# Patient Record
Sex: Male | Born: 1975 | Race: White | Hispanic: No | Marital: Married | Smoking: Current every day smoker
Health system: Southern US, Community
[De-identification: ages and names within clinical notes are randomized; demographics above are authoritative.]

## PROBLEM LIST (undated history)

## (undated) DIAGNOSIS — F319 Bipolar disorder, unspecified: Secondary | ICD-10-CM

## (undated) DIAGNOSIS — R569 Unspecified convulsions: Secondary | ICD-10-CM

## (undated) DIAGNOSIS — I2699 Other pulmonary embolism without acute cor pulmonale: Secondary | ICD-10-CM

## (undated) HISTORY — PX: SPINAL FUSION: SHX223

---

## 2005-04-22 DIAGNOSIS — I2699 Other pulmonary embolism without acute cor pulmonale: Secondary | ICD-10-CM

## 2005-04-22 HISTORY — DX: Other pulmonary embolism without acute cor pulmonale: I26.99

## 2005-08-10 ENCOUNTER — Emergency Department (HOSPITAL_COMMUNITY): Admission: EM | Admit: 2005-08-10 | Discharge: 2005-08-10 | Payer: Self-pay | Admitting: Emergency Medicine

## 2005-10-27 ENCOUNTER — Emergency Department (HOSPITAL_COMMUNITY): Admission: EM | Admit: 2005-10-27 | Discharge: 2005-10-27 | Payer: Self-pay | Admitting: Emergency Medicine

## 2005-11-01 ENCOUNTER — Emergency Department (HOSPITAL_COMMUNITY): Admission: EM | Admit: 2005-11-01 | Discharge: 2005-11-01 | Payer: Self-pay | Admitting: Emergency Medicine

## 2005-12-04 ENCOUNTER — Emergency Department (HOSPITAL_COMMUNITY): Admission: EM | Admit: 2005-12-04 | Discharge: 2005-12-04 | Payer: Self-pay | Admitting: Emergency Medicine

## 2006-03-06 ENCOUNTER — Emergency Department (HOSPITAL_COMMUNITY): Admission: EM | Admit: 2006-03-06 | Discharge: 2006-03-06 | Payer: Self-pay | Admitting: Emergency Medicine

## 2006-04-03 ENCOUNTER — Emergency Department (HOSPITAL_COMMUNITY): Admission: EM | Admit: 2006-04-03 | Discharge: 2006-04-03 | Payer: Self-pay | Admitting: Emergency Medicine

## 2006-04-07 ENCOUNTER — Emergency Department (HOSPITAL_COMMUNITY): Admission: EM | Admit: 2006-04-07 | Discharge: 2006-04-07 | Payer: Self-pay | Admitting: Emergency Medicine

## 2006-04-13 ENCOUNTER — Emergency Department (HOSPITAL_COMMUNITY): Admission: EM | Admit: 2006-04-13 | Discharge: 2006-04-13 | Payer: Self-pay | Admitting: Emergency Medicine

## 2006-04-19 ENCOUNTER — Emergency Department (HOSPITAL_COMMUNITY): Admission: EM | Admit: 2006-04-19 | Discharge: 2006-04-19 | Payer: Self-pay | Admitting: Emergency Medicine

## 2006-07-12 ENCOUNTER — Emergency Department (HOSPITAL_COMMUNITY): Admission: EM | Admit: 2006-07-12 | Discharge: 2006-07-12 | Payer: Self-pay | Admitting: Emergency Medicine

## 2006-09-21 ENCOUNTER — Emergency Department (HOSPITAL_COMMUNITY): Admission: EM | Admit: 2006-09-21 | Discharge: 2006-09-21 | Payer: Self-pay | Admitting: Emergency Medicine

## 2006-09-26 ENCOUNTER — Emergency Department (HOSPITAL_COMMUNITY): Admission: EM | Admit: 2006-09-26 | Discharge: 2006-09-26 | Payer: Self-pay | Admitting: Emergency Medicine

## 2006-12-14 ENCOUNTER — Emergency Department (HOSPITAL_COMMUNITY): Admission: EM | Admit: 2006-12-14 | Discharge: 2006-12-15 | Payer: Self-pay | Admitting: Emergency Medicine

## 2007-03-09 ENCOUNTER — Emergency Department (HOSPITAL_COMMUNITY): Admission: EM | Admit: 2007-03-09 | Discharge: 2007-03-09 | Payer: Self-pay | Admitting: Emergency Medicine

## 2007-12-10 IMAGING — CR DG CHEST 2V
2 series · 2 of 2 positions shown · non-contrast
Comparison: 12/14/06 and earlier.

CLINICAL DATA: 31-year-old male with chest pain, shortness of breath, and blurred vision. 
CHEST - 2 VIEW:

[view not recorded (1 of 2)]
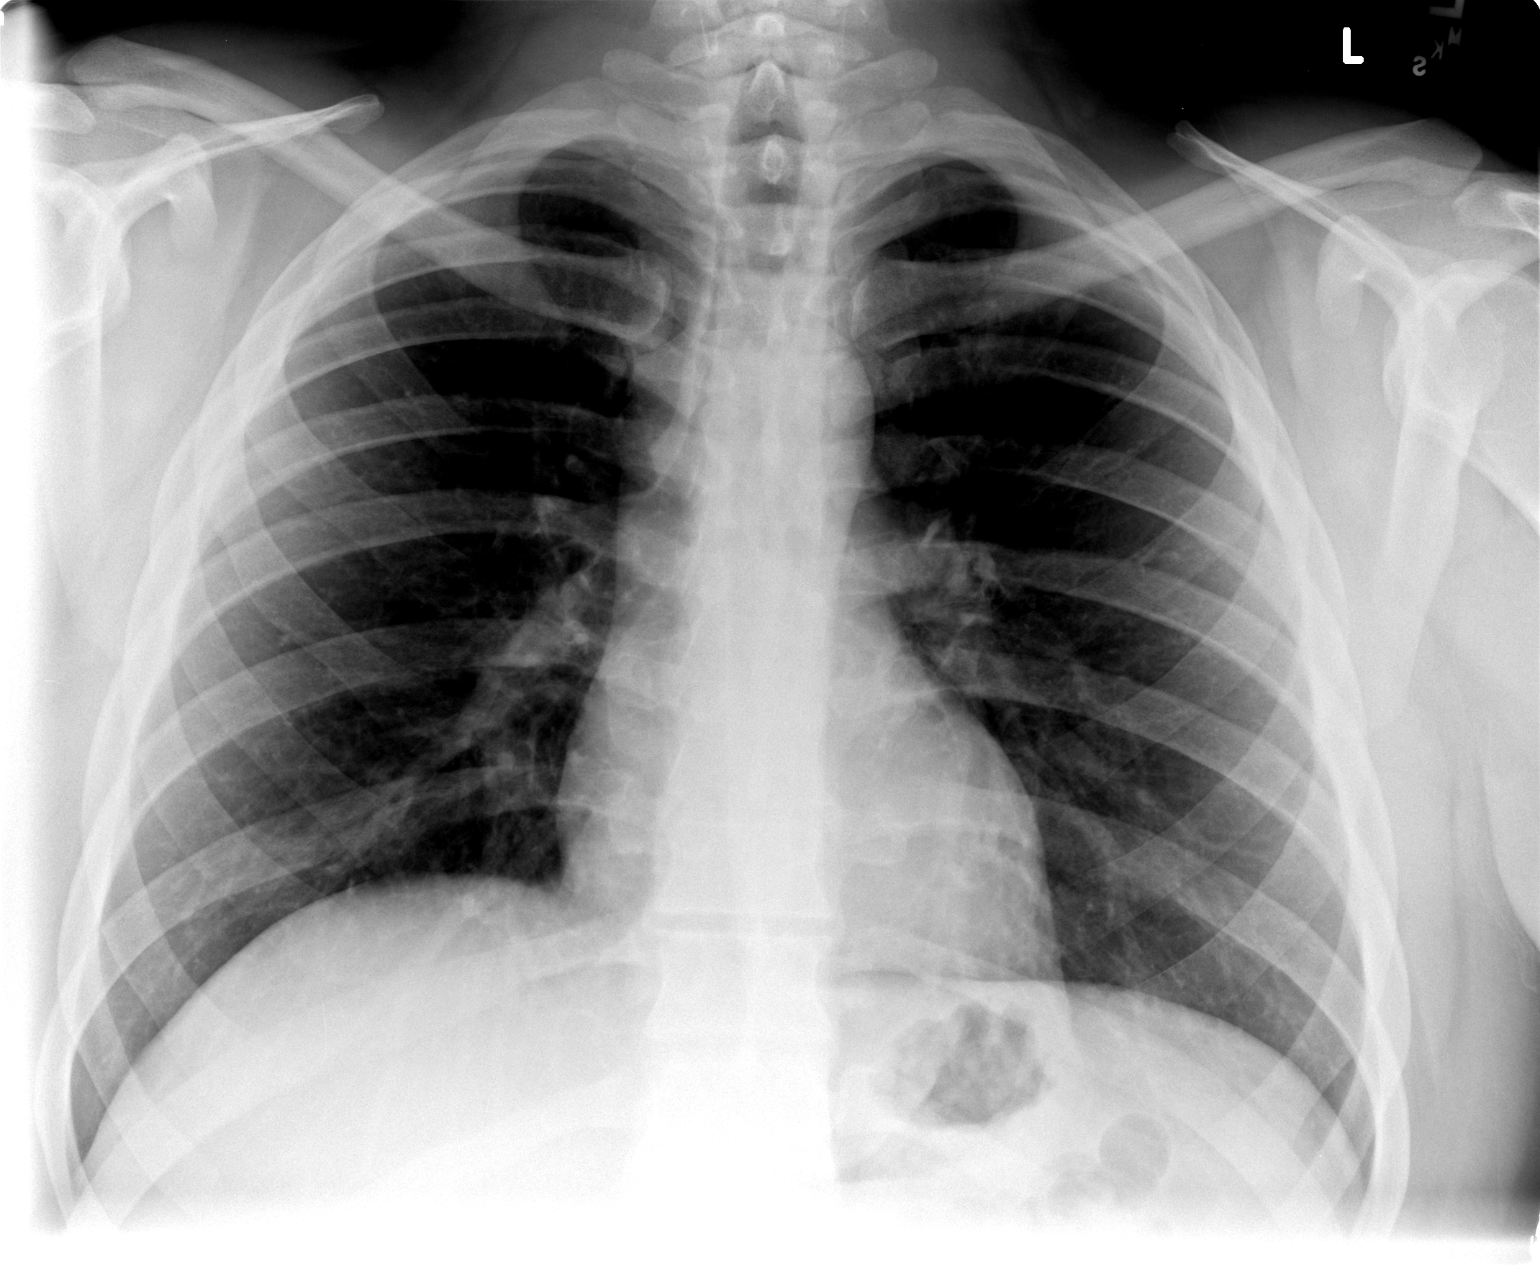

[view not recorded (2 of 2)]
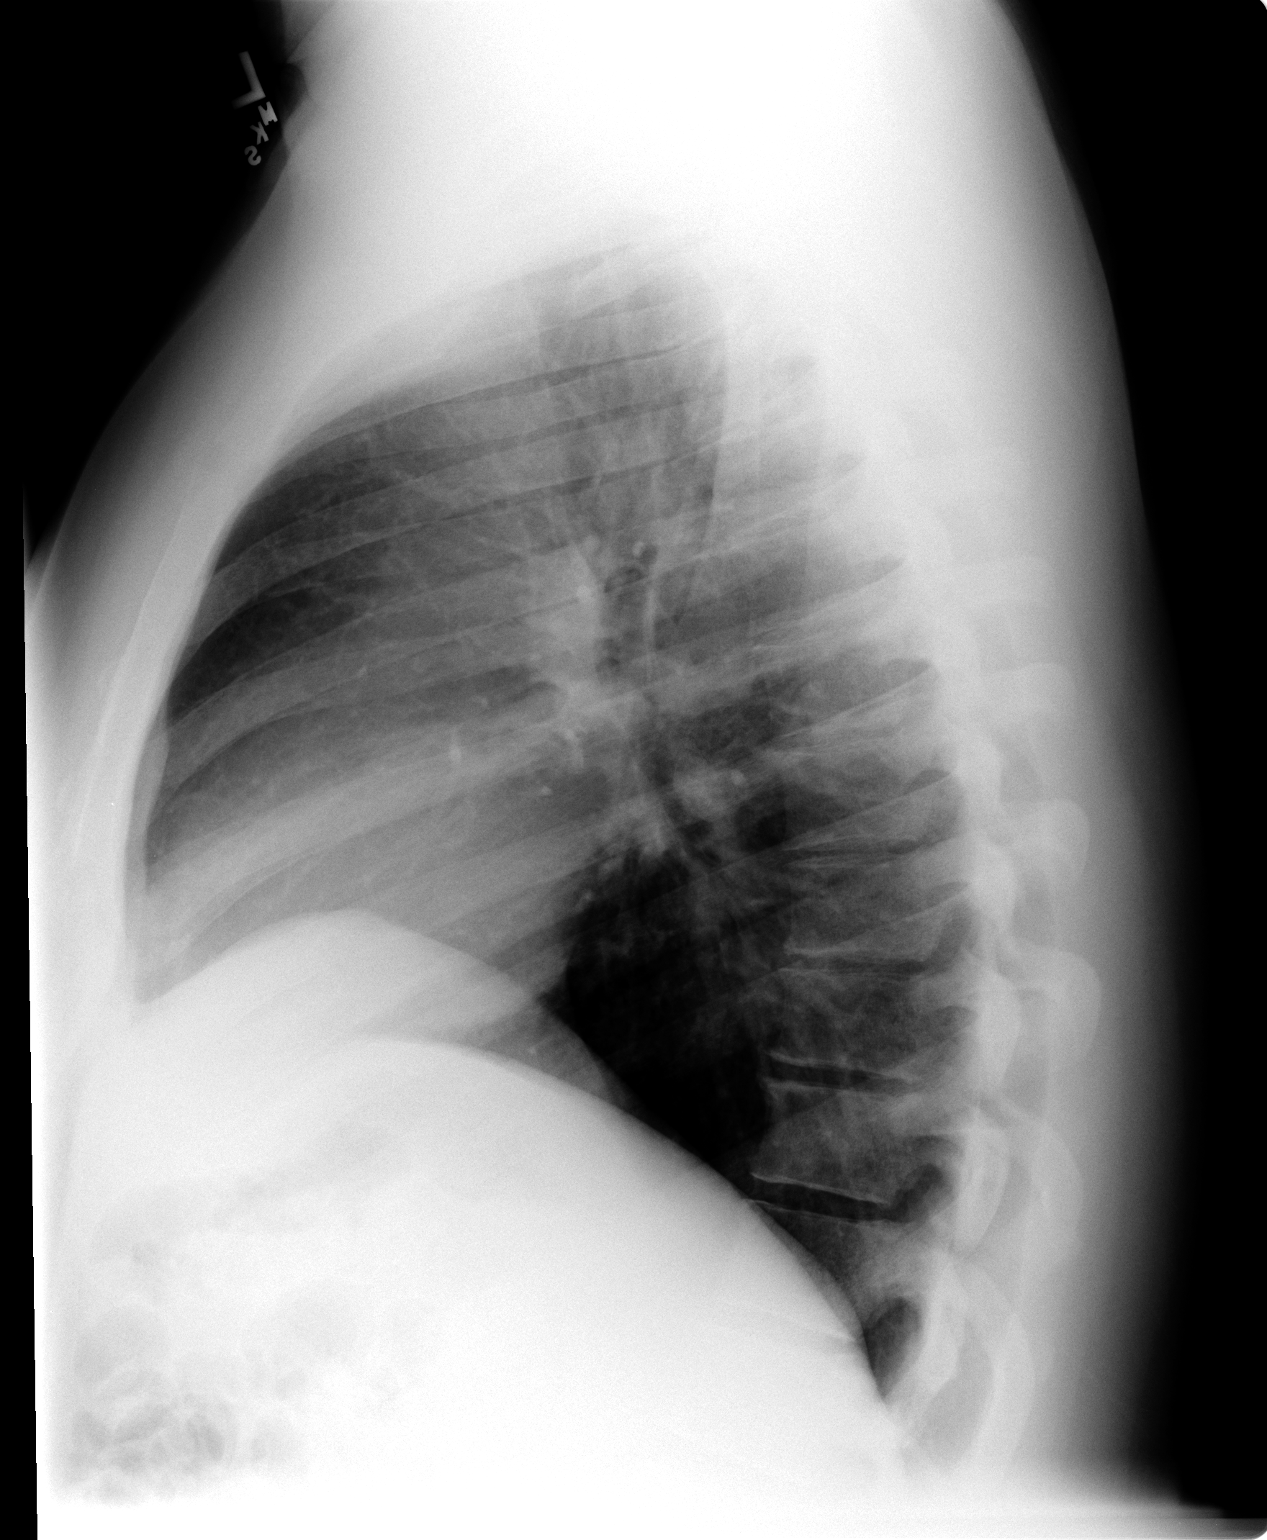

[2 of 2 positions shown; findings below may reference images not displayed]

FINDINGS: Stable, normal cardiac size and mediastinal contour. No pneumothorax. Stable, normal lung volumes. No pleural fluid, pulmonary edema, or acute air space opacity. The nodular right lung density on the prior CT dated 12/15/06 is poorly visualized. Stable visualized osseous structures.
IMPRESSION: No acute cardiopulmonary abnormality.

## 2008-12-29 ENCOUNTER — Emergency Department (HOSPITAL_COMMUNITY): Admission: EM | Admit: 2008-12-29 | Discharge: 2008-12-30 | Payer: Self-pay | Admitting: Emergency Medicine

## 2011-02-01 LAB — DIFFERENTIAL
Lymphocytes Relative: 20
Lymphs Abs: 1.8
Neutrophils Relative %: 72

## 2011-02-01 LAB — COMPREHENSIVE METABOLIC PANEL
AST: 20
CO2: 28
Calcium: 8.8
Creatinine, Ser: 0.86
GFR calc Af Amer: >60
GFR calc non Af Amer: >60
Glucose, Bld: 98
Total Protein: 6.2

## 2011-02-01 LAB — PHENYTOIN LEVEL, TOTAL: Phenytoin Lvl: 9.9 — ABNORMAL LOW

## 2011-02-01 LAB — APTT: aPTT: 30

## 2011-02-01 LAB — POCT CARDIAC MARKERS
Myoglobin, poc: 74.8
Operator id: 217071

## 2011-02-01 LAB — PROTIME-INR: INR: 1

## 2011-02-01 LAB — D-DIMER, QUANTITATIVE: D-Dimer, Quant: 0.22

## 2011-02-01 LAB — CBC
MCHC: 34.7
MCV: 91.5
RBC: 4.27
RDW: 12.1

## 2011-02-07 LAB — PROTIME-INR: INR: 1

## 2011-02-07 LAB — DIFFERENTIAL
Basophils Absolute: 0
Lymphocytes Relative: 35
Neutro Abs: 3.2

## 2011-02-07 LAB — BASIC METABOLIC PANEL
BUN: 8
Calcium: 9.7
GFR calc non Af Amer: >60
Glucose, Bld: 89
Sodium: 137

## 2011-02-07 LAB — APTT: aPTT: 26

## 2011-02-07 LAB — CBC
Hemoglobin: 14.7
Platelets: 211
RDW: 12.7
WBC: 6.1

## 2011-02-07 LAB — D-DIMER, QUANTITATIVE: D-Dimer, Quant: 0.22

## 2011-10-28 ENCOUNTER — Encounter (HOSPITAL_COMMUNITY): Payer: Self-pay | Admitting: *Deleted

## 2011-10-28 ENCOUNTER — Emergency Department (HOSPITAL_COMMUNITY): Payer: Self-pay

## 2011-10-28 ENCOUNTER — Emergency Department (HOSPITAL_COMMUNITY)
Admission: EM | Admit: 2011-10-28 | Discharge: 2011-10-28 | Discharge status: Against Medical Advice | Payer: Self-pay | Attending: Emergency Medicine | Admitting: Emergency Medicine

## 2011-10-28 DIAGNOSIS — R0602 Shortness of breath: Secondary | ICD-10-CM | POA: Insufficient documentation

## 2011-10-28 DIAGNOSIS — F319 Bipolar disorder, unspecified: Secondary | ICD-10-CM | POA: Insufficient documentation

## 2011-10-28 DIAGNOSIS — R079 Chest pain, unspecified: Secondary | ICD-10-CM | POA: Insufficient documentation

## 2011-10-28 HISTORY — DX: Unspecified convulsions: R56.9

## 2011-10-28 HISTORY — DX: Bipolar disorder, unspecified: F31.9

## 2011-10-28 MED ORDER — OXYCODONE-ACETAMINOPHEN 5-325 MG PO TABS
2.0000 | ORAL_TABLET | Freq: Once | ORAL | Status: AC
  Administered 2011-10-28: 2 via ORAL
  Filled 2011-10-28: qty 2

## 2011-10-28 MED ORDER — LORAZEPAM 1 MG PO TABS
1.0000 mg | ORAL_TABLET | Freq: Once | ORAL | Status: AC
  Administered 2011-10-28: 1 mg via ORAL
  Filled 2011-10-28: qty 1

## 2011-10-28 MED ORDER — ONDANSETRON 4 MG PO TBDP
4.0000 mg | ORAL_TABLET | Freq: Once | ORAL | Status: AC
  Administered 2011-10-28: 4 mg via ORAL
  Filled 2011-10-28: qty 1

## 2011-10-28 NOTE — ED Notes (Signed)
C/o nausea  MD aware  Order received

## 2011-10-28 NOTE — ED Notes (Signed)
States nothing has helped so far, is very concerned that he is having a pulmonary embolism, very loud, states he does not want [place] to think he is lying - he is having "severe" pain.  Continues to converse with his family (mother and son in room with patient).

## 2011-10-28 NOTE — ED Notes (Signed)
Patient was already in his wheelchair and ready to leave - MD advised he is leaving against medical advice - patient states he is leaving to go somewhere he can get IV narcotics for his pain.  Left with his son and his mother

## 2011-10-28 NOTE — ED Notes (Signed)
Pt states he got into an argument with his wife and has been having chest pain, nausea, and sob x 4.5 hrs.

## 2011-10-28 NOTE — ED Notes (Addendum)
MD in to talk with patient - Patient loud and using profanity related to not getting Dilaudid or Oxycontin - MD advised additional evaluation to rule out PE even though he is on Xarelto- which patient states he has been taking as prescribed.  Patient expressed wish for IV narcotics - states apparently he came to the wrong place if we are not giving him any IV narcotics - the percocet tabs might as well be tylenol as much as they have helped"

## 2011-10-28 NOTE — ED Notes (Signed)
MD at bedside.Miller 

## 2011-10-28 NOTE — ED Notes (Signed)
Patient has not stopped talking since he arrived - now stating "I hope I have a heart attack and die in the parking lot, then the hospital will have bad publicity"
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
## 2011-10-28 NOTE — ED Notes (Signed)
States he if very frightened - has not had "pain like this before"  Does not think the medicine given will help him any "because he has a very high tolerance now and takes Neurontin 3,600 mg per day.   Also states he is "BiPolar"    Left [place] ED tonight and came here "because they put me in a room and did not do anything for me"  Admits they did draw blood and also blood gas - left before anything else was done for him.

## 2011-11-27 ENCOUNTER — Encounter (HOSPITAL_COMMUNITY): Payer: Self-pay

## 2011-11-27 ENCOUNTER — Emergency Department (HOSPITAL_COMMUNITY)
Admission: EM | Admit: 2011-11-27 | Discharge: 2011-11-27 | Disposition: A | Discharge status: Home / Self Care | Payer: Medicaid - Out of State | Attending: Emergency Medicine | Admitting: Emergency Medicine

## 2011-11-27 ENCOUNTER — Emergency Department (HOSPITAL_COMMUNITY): Payer: Medicaid - Out of State

## 2011-11-27 DIAGNOSIS — E876 Hypokalemia: Secondary | ICD-10-CM | POA: Insufficient documentation

## 2011-11-27 DIAGNOSIS — Z79899 Other long term (current) drug therapy: Secondary | ICD-10-CM | POA: Insufficient documentation

## 2011-11-27 DIAGNOSIS — K625 Hemorrhage of anus and rectum: Secondary | ICD-10-CM

## 2011-11-27 DIAGNOSIS — R000 Tachycardia, unspecified: Secondary | ICD-10-CM | POA: Insufficient documentation

## 2011-11-27 DIAGNOSIS — F17200 Nicotine dependence, unspecified, uncomplicated: Secondary | ICD-10-CM | POA: Insufficient documentation

## 2011-11-27 DIAGNOSIS — F319 Bipolar disorder, unspecified: Secondary | ICD-10-CM | POA: Insufficient documentation

## 2011-11-27 DIAGNOSIS — Z86711 Personal history of pulmonary embolism: Secondary | ICD-10-CM | POA: Insufficient documentation

## 2011-11-27 DIAGNOSIS — Z7901 Long term (current) use of anticoagulants: Secondary | ICD-10-CM | POA: Insufficient documentation

## 2011-11-27 DIAGNOSIS — R109 Unspecified abdominal pain: Secondary | ICD-10-CM | POA: Insufficient documentation

## 2011-11-27 HISTORY — DX: Other pulmonary embolism without acute cor pulmonale: I26.99

## 2011-11-27 LAB — PROTIME-INR
INR: 0.93 (ref 0.00–1.49)
Prothrombin Time: 12.7 seconds (ref 11.6–15.2)

## 2011-11-27 LAB — CBC WITH DIFFERENTIAL/PLATELET
Eosinophils Absolute: 0.1 10*3/uL (ref 0.0–0.7)
Eosinophils Relative: 1 % (ref 0–5)
HCT: 42.8 % (ref 39.0–52.0)
Hemoglobin: 14.8 g/dL (ref 13.0–17.0)
Lymphs Abs: 1 10*3/uL (ref 0.7–4.0)
MCH: 30.8 pg (ref 26.0–34.0)
MCV: 89 fL (ref 78.0–100.0)
Monocytes Relative: 7 % (ref 3–12)
RBC: 4.81 MIL/uL (ref 4.22–5.81)

## 2011-11-27 LAB — POCT I-STAT, CHEM 8
Calcium, Ion: 1.23 mmol/L (ref 1.12–1.23)
Chloride: 103 mEq/L (ref 96–112)
HCT: 46 % (ref 39.0–52.0)
Potassium: 2.9 mEq/L — ABNORMAL LOW (ref 3.5–5.1)

## 2011-11-27 LAB — TYPE AND SCREEN: Antibody Screen: NEGATIVE

## 2011-11-27 MED ORDER — POTASSIUM CHLORIDE ER 10 MEQ PO TBCR: 10.0000 meq | EXTENDED_RELEASE_TABLET | Freq: Two times a day (BID) | ORAL | Status: AC

## 2011-11-27 MED ORDER — IOHEXOL 300 MG/ML  SOLN
100.0000 mL | Freq: Once | INTRAMUSCULAR | Status: AC | PRN
  Administered 2011-11-27: 100 mL via INTRAVENOUS

## 2011-11-27 NOTE — ED Notes (Signed)
Pt began having rectal bleeding while receiving enema at home.
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
## 2012-08-29 IMAGING — CT CT ABD-PELV W/ CM
2 of 3 series · 16 of 46 positions shown, 18 images · IV contrast (Omnipaque 300)
Comparison: None.

CLINICAL DATA: Rectal bleeding after enema.

CT ABDOMEN AND PELVIS WITH CONTRAST
TECHNIQUE: Multidetector CT imaging of the abdomen and pelvis was
performed following the standard protocol during bolus
administration of intravenous contrast.
Contrast: 100mL OMNIPAQUE IOHEXOL 300 MG/ML  SOLN

[Series 2: abd_pel_with 5.0 b40f · axial · 0.76mm/px · z∈[-470,-44]mm · 13 of 99 slices shown, 15 images]
[im 7/99  soft-tissue]
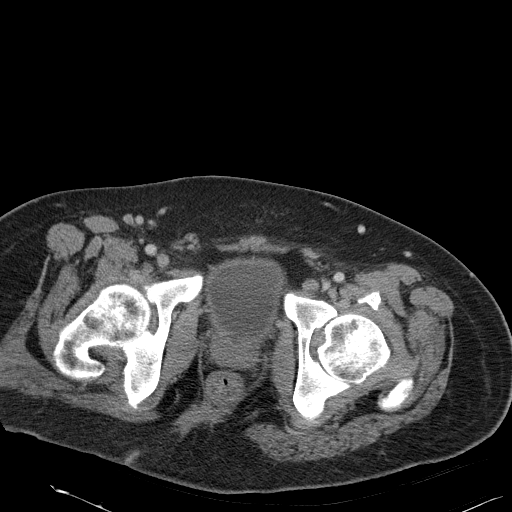
[im 7/99  bone]
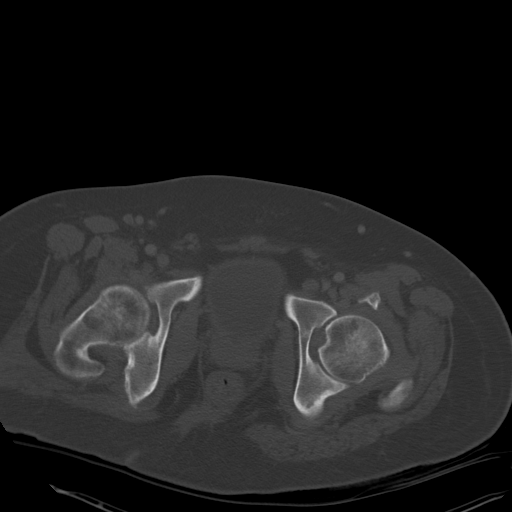
[im 13/99  soft-tissue]
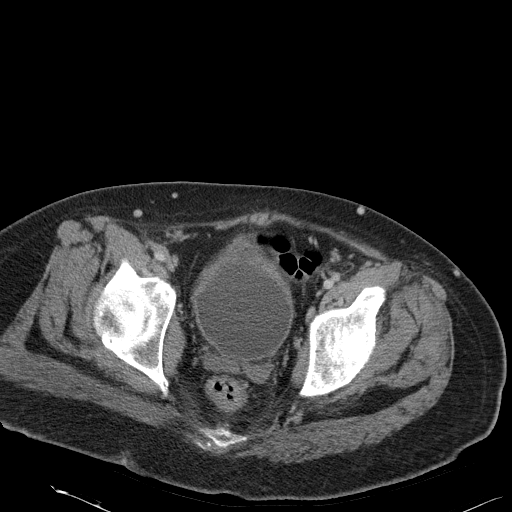
[im 19/99  soft-tissue]
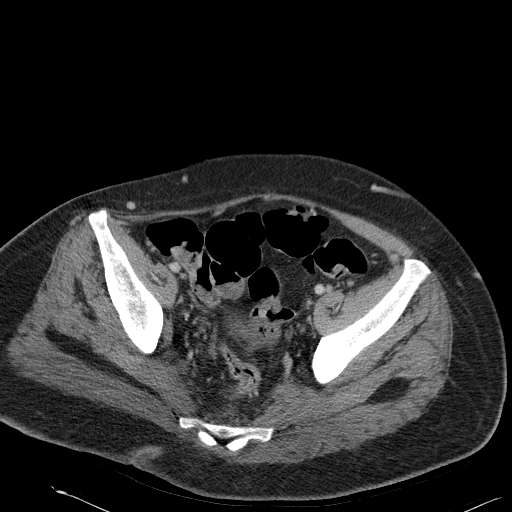
[im 29/99  soft-tissue]
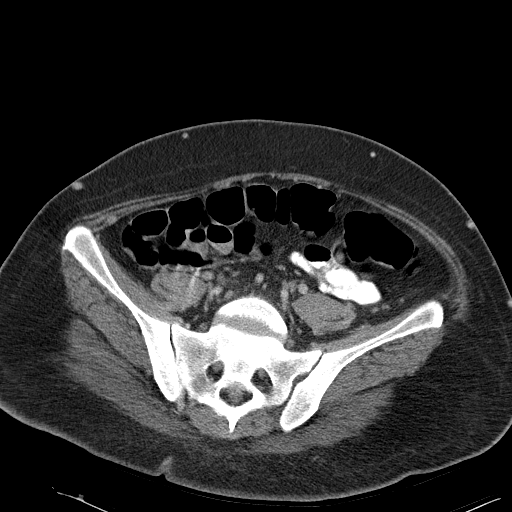
[im 35/99  soft-tissue]
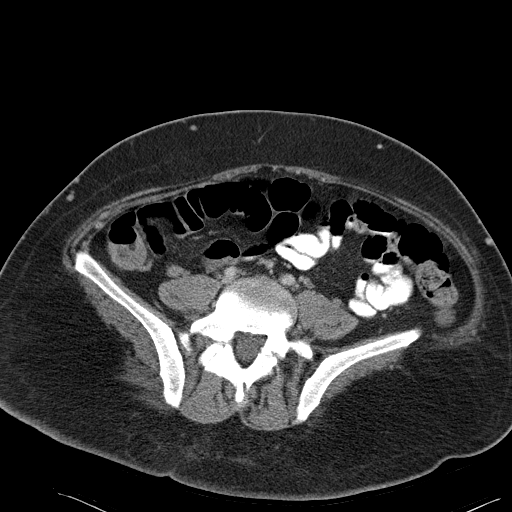
[im 42/99  soft-tissue]
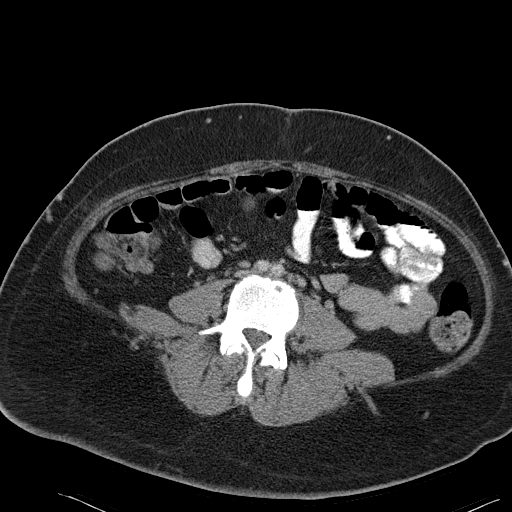
[im 51/99  soft-tissue]
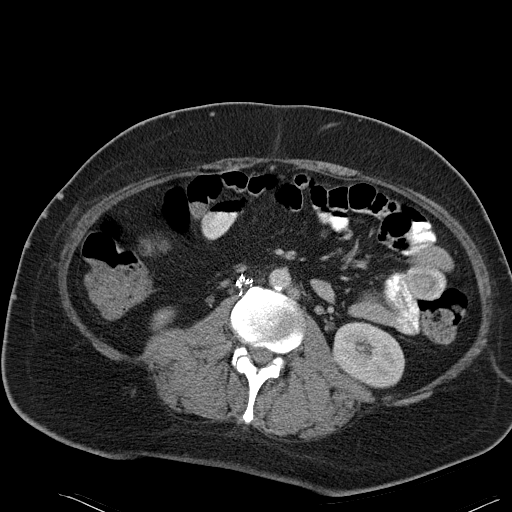
[im 57/99  soft-tissue]
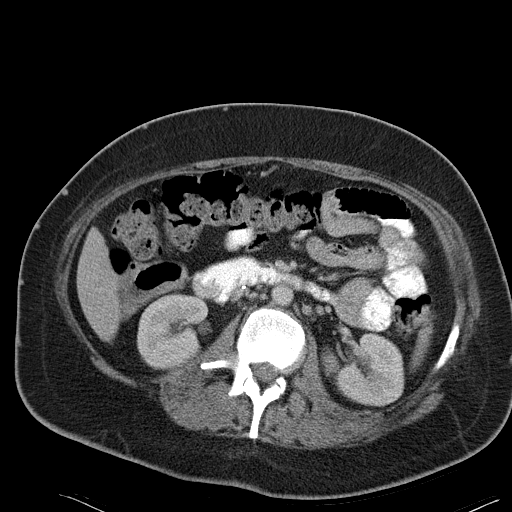
[im 64/99  soft-tissue]
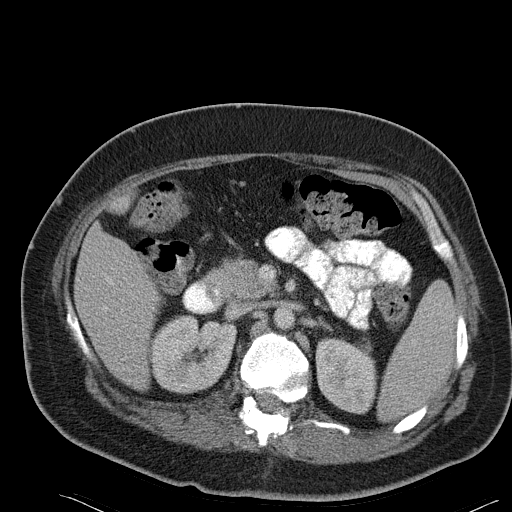
[im 64/99  bone]
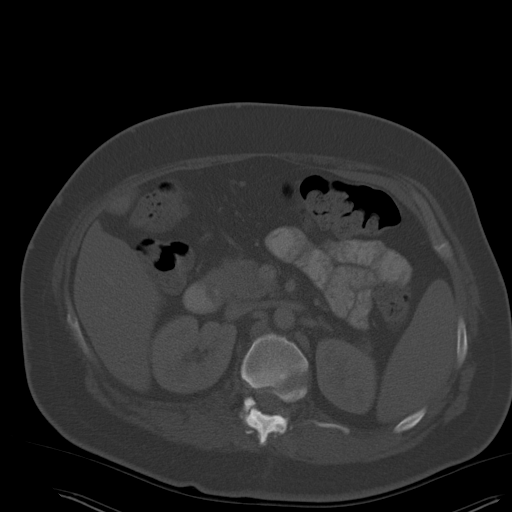
[im 70/99  soft-tissue]
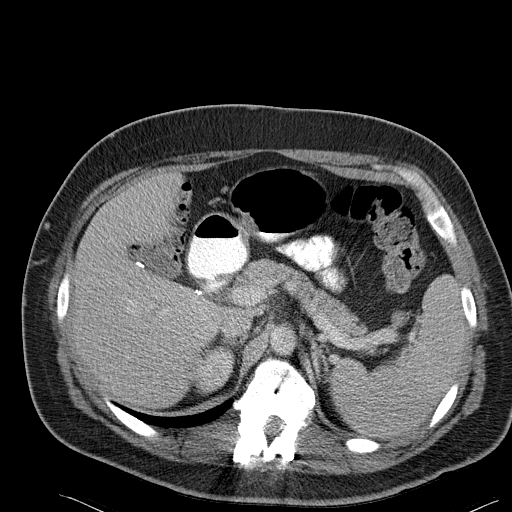
[im 80/99  soft-tissue]
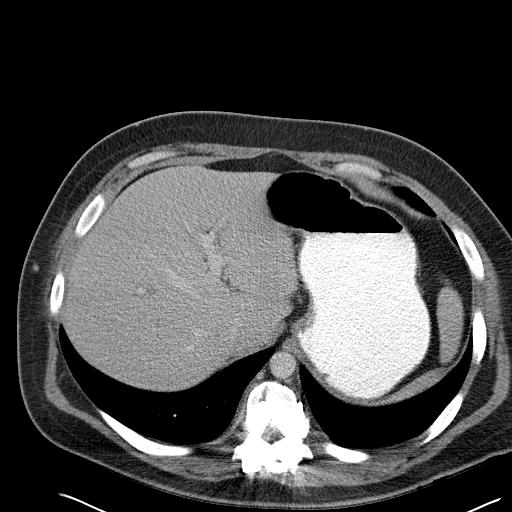
[im 86/99  soft-tissue]
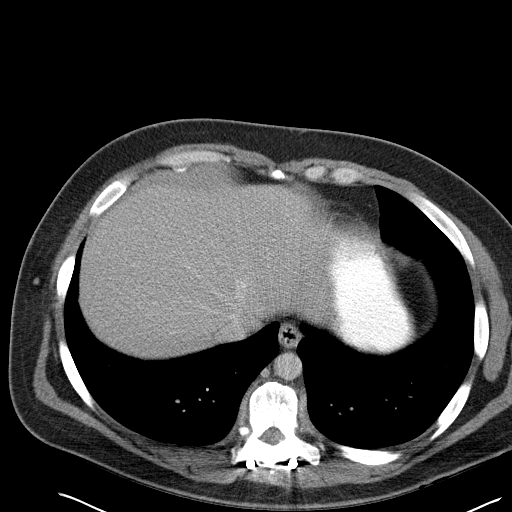
[im 92/99  soft-tissue]
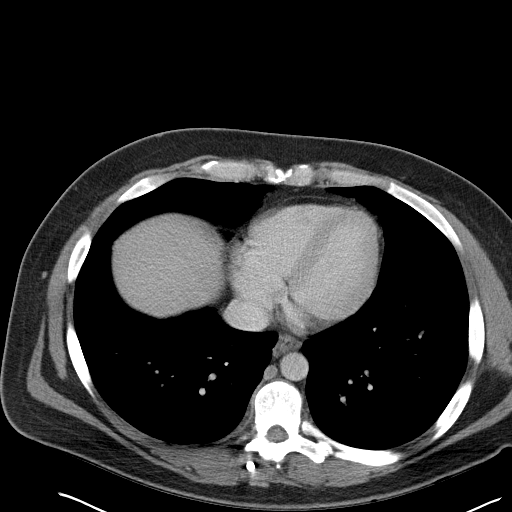

[Series 4: abd_pel_with 3.0 spo cor · coronal · 0.75mm/px · 3 of 91 slices shown]
[im 31/91  soft-tissue]
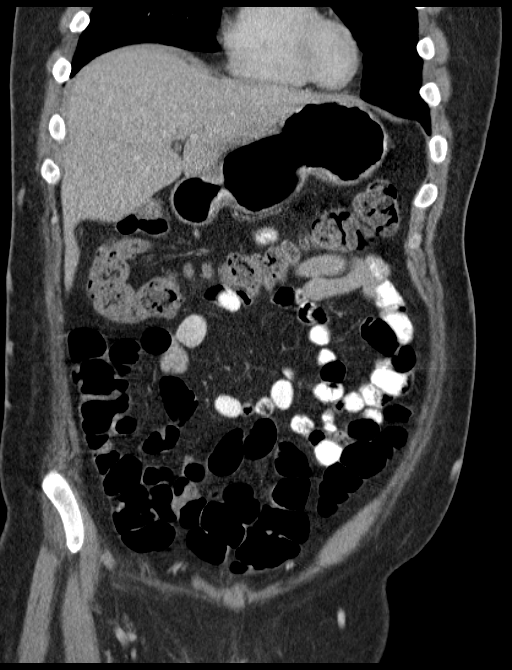
[im 41/91  soft-tissue]
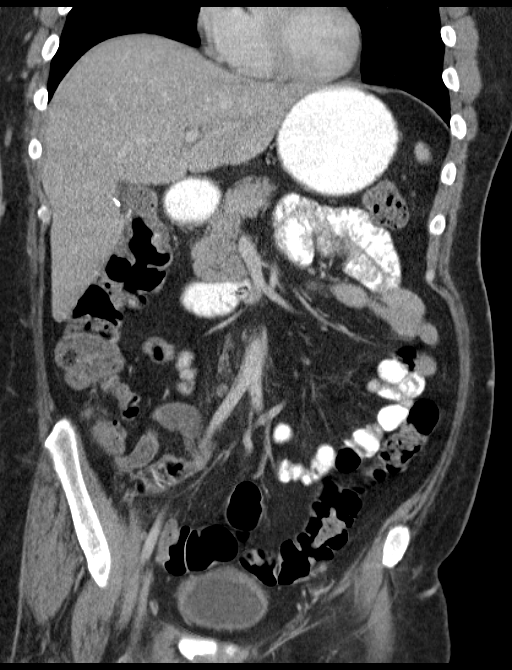
[im 51/91  soft-tissue]
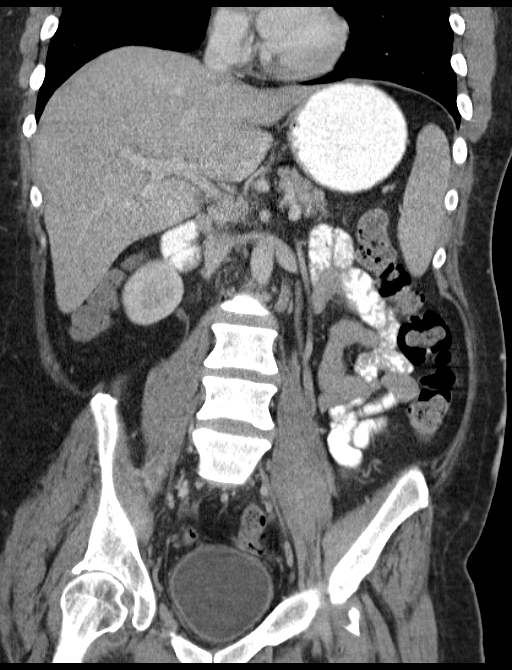

[16 of 46 positions shown; findings below may reference images not displayed]

FINDINGS: Lung bases are clear.

Surgical absence of the gallbladder.  The liver, spleen, pancreas,
adrenal glands, kidneys, abdominal aorta, and retroperitoneal lymph
nodes are unremarkable.  Small accessory spleen.  Inferior vena
caval filter.  The stomach, small bowel, and colon are not
abnormally distended.  Stool filled colon diffusely.  No free air
or free fluid in the abdomen.  Mild calcification in the abdominal
aorta.

Pelvis:  The prostate gland is not enlarged.  Bladder wall is not
thickened.  No free or loculated pelvic fluid collections.  No
diverticulitis.  The appendix is normal.  There is suggestion of
infiltration and some of the perianal fatty tissues posteriorly
which might represent scarring.  No evidence of abscess.
Compression fractures of T11 with posterior rod and screw fixation
from T9-T12.
IMPRESSION: Suggestion of some scarring or infiltration in the posterior
perirectal fat without abscess.  No acute process is otherwise
demonstrated in the abdomen or pelvis.

## 2017-11-03 ENCOUNTER — Ambulatory Visit: Payer: Medicaid - Out of State | Admitting: Cardiology
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
